# Patient Record
Sex: Female | Born: 1985 | Race: White | Hispanic: No | Marital: Married | Smoking: Never smoker
Health system: Southern US, Community
[De-identification: ages and names within clinical notes are randomized; demographics above are authoritative.]

## PROBLEM LIST (undated history)

## (undated) ENCOUNTER — Inpatient Hospital Stay (HOSPITAL_COMMUNITY): Payer: Self-pay

## (undated) DIAGNOSIS — R519 Headache, unspecified: Secondary | ICD-10-CM

## (undated) HISTORY — PX: TONSILLECTOMY: SUR1361

## (undated) HISTORY — PX: OTHER SURGICAL HISTORY: SHX169

## (undated) HISTORY — PX: WISDOM TOOTH EXTRACTION: SHX21

---
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
## 2017-04-28 ENCOUNTER — Inpatient Hospital Stay (HOSPITAL_COMMUNITY)
Admission: AD | Admit: 2017-04-28 | Discharge: 2017-04-28 | Disposition: A | Discharge status: Home / Self Care | Payer: 59 | Source: Ambulatory Visit | Attending: Obstetrics and Gynecology | Admitting: Obstetrics and Gynecology

## 2017-04-28 DIAGNOSIS — O00109 Unspecified tubal pregnancy without intrauterine pregnancy: Secondary | ICD-10-CM | POA: Insufficient documentation

## 2017-04-28 LAB — CBC
HCT: 36.4 % (ref 36.0–46.0)
HEMOGLOBIN: 13.1 g/dL (ref 12.0–15.0)
MCH: 31.7 pg (ref 26.0–34.0)
MCHC: 36 g/dL (ref 30.0–36.0)
MCV: 88.1 fL (ref 78.0–100.0)
Platelets: 244 10*3/uL (ref 150–400)
RBC: 4.13 MIL/uL (ref 3.87–5.11)
RDW: 12 % (ref 11.5–15.5)
WBC: 7.5 10*3/uL (ref 4.0–10.5)

## 2017-04-28 LAB — ABO/RH: ABO/RH(D): A POS

## 2017-04-28 LAB — COMPREHENSIVE METABOLIC PANEL
ALBUMIN: 4.7 g/dL (ref 3.5–5.0)
ALK PHOS: 53 U/L (ref 38–126)
ALT: 20 U/L (ref 14–54)
AST: 18 U/L (ref 15–41)
Anion gap: 8 (ref 5–15)
BUN: 14 mg/dL (ref 6–20)
CALCIUM: 9.1 mg/dL (ref 8.9–10.3)
CHLORIDE: 104 mmol/L (ref 101–111)
CO2: 27 mmol/L (ref 22–32)
CREATININE: 0.81 mg/dL (ref 0.44–1.00)
GFR calc Af Amer: >60 mL/min (ref >60)
GFR calc non Af Amer: >60 mL/min (ref >60)
GLUCOSE: 106 mg/dL — AB (ref 65–99)
Potassium: 3.4 mmol/L — ABNORMAL LOW (ref 3.5–5.1)
SODIUM: 139 mmol/L (ref 135–145)
Total Bilirubin: 0.8 mg/dL (ref 0.3–1.2)
Total Protein: 7.7 g/dL (ref 6.5–8.1)

## 2017-04-28 LAB — TYPE AND SCREEN
ABO/RH(D): A POS
ANTIBODY SCREEN: NEGATIVE

## 2017-04-28 LAB — HCG, QUANTITATIVE, PREGNANCY: hCG, Beta Chain, Quant, S: 788 m[IU]/mL — ABNORMAL HIGH (ref <5)

## 2017-04-28 MED ORDER — METHOTREXATE INJECTION FOR WOMEN'S HOSPITAL
50.0000 mg/m2 | Freq: Once | INTRAMUSCULAR | Status: AC
  Administered 2017-04-28: 80 mg via INTRAMUSCULAR
  Filled 2017-04-28: qty 1.6

## 2017-04-28 NOTE — MAU Note (Signed)
Chief Complaint: methotrexate injection   None    SUBJECTIVE HPI: Michaela Miller is a 31 y.o. No obstetric history on file. at Unknown who presents to Maternity Admissions reporting mod bleeding but no pain.  Presented for methotrexate injection after [place] in office revealed no IUP with hypoechoic structure on rt ovary poss enlarge fallopian tube.  Ectopic could not be ruled out.  PT QBHC were 04/22/2017 1628, 04/24/2017 1260.  Location: vaginal bleeding Quality: mod Duration: a week No past medical history on file. OB History  No data available   No past surgical history on file. Social History   Social History  . Marital status: Single    Spouse name: N/A  . Number of children: N/A  . Years of education: N/A   Occupational History  . Not on file.   Social History Main Topics  . Smoking status: Not on file  . Smokeless tobacco: Not on file  . Alcohol use Not on file  . Drug use: Unknown  . Sexual activity: Not on file   Other Topics Concern  . Not on file   Social History Narrative  . No narrative on file   No family history on file. No current facility-administered medications on file prior to encounter.    No current outpatient prescriptions on file prior to encounter.   Allergies  Allergen Reactions  . Codeine Shortness Of Breath  . Amoxicillin Rash  . Ceclor [Cefaclor] Rash  . Penicillins Rash  . Sulfa Antibiotics Rash    I have reviewed patient's Past Medical Hx, Surgical Hx, Family Hx, Social Hx, medications and allergies.   Review of Systems  Constitutional: Negative.   HENT: Negative.   Respiratory: Negative.   Cardiovascular: Negative.   Gastrointestinal: Negative.   Endocrine: Negative.   Genitourinary: Positive for vaginal bleeding.  Musculoskeletal: Negative.   Skin: Negative.   Allergic/Immunologic: Negative.   Neurological: Negative.   Hematological: Negative.   Psychiatric/Behavioral: Negative.     OBJECTIVE Patient Vitals for the past  24 hrs:  BP Temp Pulse Resp Height Weight  04/28/17 1849 131/75 98.5 F (36.9 C) 79 18  (1.676 m) 52.6 kg (116 lb)   Constitutional: Well-developed, well-nourished female in no acute distress.  Cardiovascular: normal rate Respiratory: normal rate and effort.   LAB RESULTS Results for orders placed or performed during the hospital encounter of 04/28/17 (from the past 24 hour(s))  Type and screen [facility] [place]     Status: None   Collection Time: 04/28/17  7:20 PM  Result Value Ref Range   ABO/RH(D) A POS    Antibody Screen NEG    Sample Expiration 05/01/2017   CBC     Status: None   Collection Time: 04/28/17  7:20 PM  Result Value Ref Range   WBC 7.5 4.0 - 10.5 K/uL   RBC 4.13 3.87 - 5.11 MIL/uL   Hemoglobin 13.1 12.0 - 15.0 g/dL   HCT 32.2 02.5 - 42.7 %   MCV 88.1 78.0 - 100.0 fL   MCH 31.7 26.0 - 34.0 pg   MCHC 36.0 30.0 - 36.0 g/dL   RDW 06.2 37.6 - 28.3 %   Platelets 244 150 - 400 K/uL  Comprehensive metabolic panel     Status: Abnormal   Collection Time: 04/28/17  7:20 PM  Result Value Ref Range   Sodium 139 135 - 145 mmol/L   Potassium 3.4 (L) 3.5 - 5.1 mmol/L   Chloride 104 101 - 111 mmol/L  CO2 27 22 - 32 mmol/L   Glucose, Bld 106 (H) 65 - 99 mg/dL   BUN 14 6 - 20 mg/dL   Creatinine, Ser 1.610.81 0.44 - 1.00 mg/dL   Calcium 9.1 8.9 - 09.610.3 mg/dL   Total Protein 7.7 6.5 - 8.1 g/dL   Albumin 4.7 3.5 - 5.0 g/dL   AST 18 15 - 41 U/L   ALT 20 14 - 54 U/L   Alkaline Phosphatase 53 38 - 126 U/L   Total Bilirubin 0.8 0.3 - 1.2 mg/dL   GFR calc non Af Amer >60 >60 mL/min   GFR calc Af Amer >60 >60 mL/min   Anion gap 8 5 - 15  hCG, quantitative, pregnancy     Status: Abnormal   Collection Time: 04/28/17  7:20 PM  Result Value Ref Range   hCG, Beta Chain, Quant, S 788 (H) <5 mIU/mL    IMAGING No results found.  MAU COURSE Orders Placed This Encounter  Procedures  . CBC  . Comprehensive metabolic panel  . hCG, quantitative, pregnancy  . Type  [facility] screen [facility] [place]  . ABO/Rh   Meds ordered this encounter  Medications  . methotrexate (50 mg/ml) chemo injection 80 mg    MDM Labs, injection ASSESSMENT Ectopic Pregnancy  PLAN POC reviewed with Dr. Richardson Doppole. Discussed results with pt.  Pt given Methotrexate and will follow up in office on day 4 and day 7.   Discharge home in stable condition. Ectopic precautions reviewed.     Kenney Housemanrothero, Roselyne Stalnaker Jean, CNM 04/28/2017  9:23 PM

## 2017-04-28 NOTE — MAU Note (Signed)
Pt presents for methotrexate injection. Reports pain in her right lower abdomen. Moderate amount of vaginal bleeding

## 2017-09-17 ENCOUNTER — Ambulatory Visit
Admission: EM | Admit: 2017-09-17 | Discharge: 2017-09-17 | Disposition: A | Discharge status: Home / Self Care | Payer: 59 | Attending: Family Medicine | Admitting: Family Medicine

## 2017-09-17 ENCOUNTER — Encounter: Payer: Self-pay | Admitting: Emergency Medicine

## 2017-09-17 ENCOUNTER — Other Ambulatory Visit: Payer: Self-pay

## 2017-09-17 DIAGNOSIS — S39012A Strain of muscle, fascia and tendon of lower back, initial encounter: Secondary | ICD-10-CM | POA: Diagnosis not present

## 2017-09-17 DIAGNOSIS — M542 Cervicalgia: Secondary | ICD-10-CM | POA: Diagnosis not present

## 2017-09-17 DIAGNOSIS — S29012A Strain of muscle and tendon of back wall of thorax, initial encounter: Secondary | ICD-10-CM

## 2017-09-17 DIAGNOSIS — R51 Headache: Secondary | ICD-10-CM | POA: Diagnosis not present

## 2017-09-17 MED ORDER — CYCLOBENZAPRINE HCL 10 MG PO TABS: 10.0000 mg | ORAL_TABLET | Freq: Every day | ORAL | 0 refills | Status: DC
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
## 2017-09-20 ENCOUNTER — Telehealth: Payer: Self-pay

## 2017-09-20 NOTE — Telephone Encounter (Signed)
Called to follow up with patient since visit here at [facility]. Patient instructed to call back with any questions or concerns. [facility]  

## 2017-09-22 ENCOUNTER — Other Ambulatory Visit: Payer: Self-pay

## 2017-09-22 ENCOUNTER — Encounter: Payer: Self-pay | Admitting: Emergency Medicine

## 2017-09-22 ENCOUNTER — Ambulatory Visit
Admission: EM | Admit: 2017-09-22 | Discharge: 2017-09-22 | Disposition: A | Discharge status: Home / Self Care | Payer: 59 | Attending: Family Medicine | Admitting: Family Medicine

## 2017-09-22 DIAGNOSIS — S39012D Strain of muscle, fascia and tendon of lower back, subsequent encounter: Secondary | ICD-10-CM | POA: Diagnosis not present

## 2017-09-22 DIAGNOSIS — S060X0A Concussion without loss of consciousness, initial encounter: Secondary | ICD-10-CM

## 2017-09-22 DIAGNOSIS — S060X0D Concussion without loss of consciousness, subsequent encounter: Secondary | ICD-10-CM | POA: Diagnosis not present

## 2017-09-22 DIAGNOSIS — F0781 Postconcussional syndrome: Secondary | ICD-10-CM
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
## 2017-09-25 ENCOUNTER — Telehealth: Payer: Self-pay

## 2017-09-25 NOTE — Telephone Encounter (Signed)
[facility] Spoke with pt, reports some improvement. Encouraged follow up as needed. Patient instructed to call back with any questions or concerns. [facility]

## 2017-10-02 DIAGNOSIS — M546 Pain in thoracic spine: Secondary | ICD-10-CM | POA: Diagnosis not present

## 2017-10-02 DIAGNOSIS — M419 Scoliosis, unspecified: Secondary | ICD-10-CM | POA: Diagnosis not present

## 2017-10-02 DIAGNOSIS — M549 Dorsalgia, unspecified: Secondary | ICD-10-CM | POA: Diagnosis not present

## 2017-10-02 DIAGNOSIS — M542 Cervicalgia: Secondary | ICD-10-CM | POA: Diagnosis not present

## 2017-10-20 DIAGNOSIS — M542 Cervicalgia: Secondary | ICD-10-CM | POA: Diagnosis not present

## 2017-10-20 DIAGNOSIS — M545 Low back pain: Secondary | ICD-10-CM | POA: Diagnosis not present

## 2017-10-20 DIAGNOSIS — M419 Scoliosis, unspecified: Secondary | ICD-10-CM | POA: Diagnosis not present

## 2017-12-19 DIAGNOSIS — M542 Cervicalgia: Secondary | ICD-10-CM | POA: Diagnosis not present

## 2017-12-19 DIAGNOSIS — R51 Headache: Secondary | ICD-10-CM | POA: Diagnosis not present

## 2017-12-19 DIAGNOSIS — Z681 Body mass index (BMI) 19 or less, adult: Secondary | ICD-10-CM | POA: Diagnosis not present

## 2018-02-17 DIAGNOSIS — R51 Headache: Secondary | ICD-10-CM | POA: Diagnosis not present

## 2018-02-17 DIAGNOSIS — R29818 Other symptoms and signs involving the nervous system: Secondary | ICD-10-CM | POA: Diagnosis not present

## 2018-02-17 DIAGNOSIS — M542 Cervicalgia: Secondary | ICD-10-CM | POA: Diagnosis not present

## 2018-02-25 DIAGNOSIS — M542 Cervicalgia: Secondary | ICD-10-CM | POA: Diagnosis not present

## 2018-02-25 DIAGNOSIS — R51 Headache: Secondary | ICD-10-CM | POA: Diagnosis not present

## 2018-03-03 ENCOUNTER — Encounter

## 2018-03-03 ENCOUNTER — Ambulatory Visit: Payer: 59 | Admitting: Neurology

## 2018-03-09 DIAGNOSIS — M542 Cervicalgia: Secondary | ICD-10-CM | POA: Diagnosis not present

## 2018-03-09 DIAGNOSIS — R51 Headache: Secondary | ICD-10-CM | POA: Diagnosis not present

## 2018-03-09 DIAGNOSIS — Z3040 Encounter for surveillance of contraceptives, unspecified: Secondary | ICD-10-CM | POA: Diagnosis not present

## 2018-03-09 DIAGNOSIS — Z6820 Body mass index (BMI) 20.0-20.9, adult: Secondary | ICD-10-CM | POA: Diagnosis not present

## 2018-03-09 DIAGNOSIS — Z01419 Encounter for gynecological examination (general) (routine) without abnormal findings: Secondary | ICD-10-CM | POA: Diagnosis not present

## 2018-03-18 DIAGNOSIS — J069 Acute upper respiratory infection, unspecified: Secondary | ICD-10-CM | POA: Diagnosis not present

## 2018-03-18 DIAGNOSIS — J029 Acute pharyngitis, unspecified: Secondary | ICD-10-CM | POA: Diagnosis not present

## 2018-06-12 DIAGNOSIS — G43919 Migraine, unspecified, intractable, without status migrainosus: Secondary | ICD-10-CM | POA: Diagnosis not present

## 2018-06-12 DIAGNOSIS — Z6820 Body mass index (BMI) 20.0-20.9, adult: Secondary | ICD-10-CM | POA: Diagnosis not present

## 2018-07-22 DIAGNOSIS — G43919 Migraine, unspecified, intractable, without status migrainosus: Secondary | ICD-10-CM | POA: Diagnosis not present

## 2018-09-22 DIAGNOSIS — G43919 Migraine, unspecified, intractable, without status migrainosus: Secondary | ICD-10-CM | POA: Diagnosis not present

## 2018-09-22 DIAGNOSIS — Z6820 Body mass index (BMI) 20.0-20.9, adult: Secondary | ICD-10-CM | POA: Diagnosis not present

## 2019-07-27 ENCOUNTER — Encounter (HOSPITAL_COMMUNITY): Payer: Self-pay

## 2019-08-20 NOTE — L&D Delivery Note (Signed)
Delivery Note Labor onset: 02/14/2020  Labor Onset Time: 1530 Complete dilation at 9:46 PM  Onset of pushing at 2148 FHR second stage Cat 1 Analgesia/Anesthesia intrapartum: Epidural  Guided pushing with maternal urge. Delivery of a viable female at 2224. Fetal head delivered in LOA position. Nuchal cord none. Head delivered w/ force of 2 ctx with well-controlled pushing efforts. Shoulders immediately followed and father allowed to assist w/ delivery of torso and placing infant on maternal abd. Lusty cry noted at birth. Infant placed on maternal abd, dried, and tactile stim.  Cord double clamped and cut after 2 min by Father, Chanetta Marshall.  Cord blood sample collected Arterial cord blood sample N/A.  Placenta delivered Tomasa Blase, intact, with 3 VC.  Placenta to L&D. Uterine tone boggy, bleeding brisk. Fundus firm and bleeding decreased after bimanual massage, Pitocin bolus, and Cytotec 800 mcg rectall  Vaginal and right labial laceration identified w/ perineum intact.  Anesthesia: Epidural Repair: 3-0 Vicryl CT, 4-0 Vicryl SH EBL (mL): 400 Complications: none APGAR: APGAR (1 MIN): 8  APGAR (5 MINS):  9 APGAR (10 MINS):   Mom to postpartum.  Baby to Couplet care / Skin to Skin.  Roma Schanz MSN, CNM 02/14/2020, 11:18 PM

## 2019-09-09 ENCOUNTER — Other Ambulatory Visit (HOSPITAL_COMMUNITY): Payer: Self-pay | Admitting: Obstetrics and Gynecology

## 2019-09-09 DIAGNOSIS — Z3A19 19 weeks gestation of pregnancy: Secondary | ICD-10-CM

## 2019-09-09 DIAGNOSIS — Z363 Encounter for antenatal screening for malformations: Secondary | ICD-10-CM

## 2019-09-22 ENCOUNTER — Other Ambulatory Visit: Payer: Self-pay

## 2019-09-22 ENCOUNTER — Ambulatory Visit (HOSPITAL_COMMUNITY)
Admission: RE | Admit: 2019-09-22 | Discharge: 2019-09-22 | Disposition: A | Discharge status: Home / Self Care | Payer: Managed Care, Other (non HMO) | Source: Ambulatory Visit | Attending: Obstetrics and Gynecology | Admitting: Obstetrics and Gynecology

## 2019-09-22 ENCOUNTER — Other Ambulatory Visit (HOSPITAL_COMMUNITY): Payer: Self-pay | Admitting: Family Medicine

## 2019-09-22 DIAGNOSIS — Z3A19 19 weeks gestation of pregnancy: Secondary | ICD-10-CM | POA: Diagnosis not present

## 2019-09-22 DIAGNOSIS — Z363 Encounter for antenatal screening for malformations: Secondary | ICD-10-CM | POA: Insufficient documentation

## 2020-01-21 LAB — OB RESULTS CONSOLE GBS: GBS: NEGATIVE

## 2020-02-14 ENCOUNTER — Inpatient Hospital Stay (HOSPITAL_COMMUNITY): Payer: Managed Care, Other (non HMO) | Admitting: Anesthesiology

## 2020-02-14 ENCOUNTER — Inpatient Hospital Stay (HOSPITAL_COMMUNITY)
Admission: AD | Admit: 2020-02-14 | Discharge: 2020-02-14 | Disposition: A | Discharge status: Home / Self Care | Payer: Managed Care, Other (non HMO) | Source: Home / Self Care | Attending: Obstetrics and Gynecology | Admitting: Obstetrics and Gynecology

## 2020-02-14 ENCOUNTER — Other Ambulatory Visit: Payer: Self-pay

## 2020-02-14 ENCOUNTER — Inpatient Hospital Stay (HOSPITAL_COMMUNITY)
Admission: AD | Admit: 2020-02-14 | Discharge: 2020-02-16 | DRG: 807 | Disposition: A | Discharge status: Home / Self Care | Payer: Managed Care, Other (non HMO) | Attending: Obstetrics and Gynecology | Admitting: Obstetrics and Gynecology

## 2020-02-14 ENCOUNTER — Encounter (HOSPITAL_COMMUNITY): Payer: Self-pay | Admitting: Obstetrics and Gynecology

## 2020-02-14 DIAGNOSIS — O479 False labor, unspecified: Secondary | ICD-10-CM

## 2020-02-14 DIAGNOSIS — Z3A39 39 weeks gestation of pregnancy: Secondary | ICD-10-CM | POA: Diagnosis not present

## 2020-02-14 DIAGNOSIS — O26893 Other specified pregnancy related conditions, third trimester: Secondary | ICD-10-CM | POA: Diagnosis present

## 2020-02-14 DIAGNOSIS — Z20822 Contact with and (suspected) exposure to covid-19: Secondary | ICD-10-CM | POA: Diagnosis present

## 2020-02-14 HISTORY — DX: Headache, unspecified: R51.9

## 2020-02-14 LAB — CBC
HCT: 34.4 % — ABNORMAL LOW (ref 36.0–46.0)
Hemoglobin: 11.8 g/dL — ABNORMAL LOW (ref 12.0–15.0)
MCH: 30.3 pg (ref 26.0–34.0)
MCHC: 34.3 g/dL (ref 30.0–36.0)
MCV: 88.4 fL (ref 80.0–100.0)
Platelets: 237 10*3/uL (ref 150–400)
RBC: 3.89 MIL/uL (ref 3.87–5.11)
RDW: 12.3 % (ref 11.5–15.5)
WBC: 15.8 10*3/uL — ABNORMAL HIGH (ref 4.0–10.5)
nRBC: 0 % (ref 0.0–0.2)

## 2020-02-14 LAB — SARS CORONAVIRUS 2 BY RT PCR (HOSPITAL ORDER, PERFORMED IN ~~LOC~~ HOSPITAL LAB): SARS Coronavirus 2: NEGATIVE

## 2020-02-14 LAB — TYPE AND SCREEN
ABO/RH(D): A POS
Antibody Screen: NEGATIVE

## 2020-02-14 LAB — ABO/RH: ABO/RH(D): A POS

## 2020-02-14 MED ORDER — MISOPROSTOL 200 MCG PO TABS: 800.0000 ug | ORAL_TABLET | Freq: Once | ORAL | Status: AC

## 2020-02-14 MED ORDER — SOD CITRATE-CITRIC ACID 500-334 MG/5ML PO SOLN: 30.0000 mL | ORAL | Status: DC | PRN

## 2020-02-14 MED ORDER — LIDOCAINE HCL (PF) 1 % IJ SOLN
INTRAMUSCULAR | Status: DC | PRN
  Administered 2020-02-14: 4 mL via EPIDURAL
  Administered 2020-02-14: 6 mL via EPIDURAL

## 2020-02-14 MED ORDER — SODIUM CHLORIDE (PF) 0.9 % IJ SOLN
INTRAMUSCULAR | Status: DC | PRN
  Administered 2020-02-14: 12 mL/h via EPIDURAL

## 2020-02-14 MED ORDER — OXYTOCIN BOLUS FROM INFUSION
333.0000 mL | Freq: Once | INTRAVENOUS | Status: AC
  Administered 2020-02-14: 333 mL via INTRAVENOUS

## 2020-02-14 MED ORDER — PHENYLEPHRINE 40 MCG/ML (10ML) SYRINGE FOR IV PUSH (FOR BLOOD PRESSURE SUPPORT): 80.0000 ug | PREFILLED_SYRINGE | INTRAVENOUS | Status: DC | PRN

## 2020-02-14 MED ORDER — FENTANYL CITRATE (PF) 100 MCG/2ML IJ SOLN: 50.0000 ug | INTRAMUSCULAR | Status: DC | PRN

## 2020-02-14 MED ORDER — LACTATED RINGERS IV SOLN: 500.0000 mL | Freq: Once | INTRAVENOUS | Status: DC

## 2020-02-14 MED ORDER — ACETAMINOPHEN 325 MG PO TABS: 650.0000 mg | ORAL_TABLET | ORAL | Status: DC | PRN

## 2020-02-14 MED ORDER — ONDANSETRON HCL 4 MG/2ML IJ SOLN: 4.0000 mg | Freq: Four times a day (QID) | INTRAMUSCULAR | Status: DC | PRN

## 2020-02-14 MED ORDER — LACTATED RINGERS IV SOLN: INTRAVENOUS | Status: DC

## 2020-02-14 MED ORDER — OXYTOCIN-SODIUM CHLORIDE 30-0.9 UT/500ML-% IV SOLN
2.5000 [IU]/h | INTRAVENOUS | Status: DC
  Filled 2020-02-14: qty 500

## 2020-02-14 MED ORDER — LIDOCAINE HCL (PF) 1 % IJ SOLN: 30.0000 mL | INTRAMUSCULAR | Status: DC | PRN

## 2020-02-14 MED ORDER — FENTANYL-BUPIVACAINE-NACL 0.5-0.125-0.9 MG/250ML-% EP SOLN
12.0000 mL/h | EPIDURAL | Status: DC | PRN
  Filled 2020-02-14: qty 250

## 2020-02-14 MED ORDER — MISOPROSTOL 200 MCG PO TABS
ORAL_TABLET | ORAL | Status: AC
  Administered 2020-02-14: 800 ug
  Filled 2020-02-14: qty 4

## 2020-02-14 MED ORDER — EPHEDRINE 5 MG/ML INJ: 10.0000 mg | INTRAVENOUS | Status: DC | PRN

## 2020-02-14 MED ORDER — DIPHENHYDRAMINE HCL 50 MG/ML IJ SOLN: 12.5000 mg | INTRAMUSCULAR | Status: DC | PRN

## 2020-02-14 MED ORDER — LACTATED RINGERS IV SOLN: 500.0000 mL | INTRAVENOUS | Status: DC | PRN

## 2020-02-14 NOTE — MAU Note (Signed)
First baby.  1/90/-1 at [facility] this morning.  Started contracting last night, became more intense around 0400, now ~Q6-7.

## 2020-02-14 NOTE — MAU Note (Signed)
Contractions have gotten stronger and closer.  Denies bleeding or LOF.

## 2020-02-14 NOTE — Anesthesia Procedure Notes (Signed)
Epidural Patient location during procedure: OB Start time: 02/14/2020 6:40 PM End time: 02/14/2020 6:51 PM  Staffing Anesthesiologist: Lucretia Kern, MD Performed: anesthesiologist   Preanesthetic Checklist Completed: patient identified, IV checked, risks and benefits discussed, monitors and equipment checked, pre-op evaluation and timeout performed  Epidural Patient position: sitting Prep: DuraPrep Patient monitoring: heart rate, continuous pulse ox and blood pressure Approach: midline Location: L3-L4 Injection technique: LOR air  Needle:  Needle type: Tuohy  Needle gauge: 17 G Needle length: 9 cm Needle insertion depth: 4 cm Catheter type: closed end flexible Catheter size: 19 Gauge Catheter at skin depth: 9 cm Test dose: negative  Assessment Events: blood not aspirated, injection not painful, no injection resistance, no paresthesia and negative IV test  Additional Notes Reason for block:procedure for pain

## 2020-02-14 NOTE — H&P (Addendum)
OB ADMISSION/ HISTORY & PHYSICAL:  Admission Date: 02/14/2020  4:05 PM  Admit Diagnosis: Prodromal latent labor  Michaela Miller is a 34 y.o. female G2P0010 [redacted]w[redacted]d presenting for increase in strength and frequency of contractions. Endorses active FM, denies LOF and vaginal bleeding. Ctx began @ 0400 about 5-7 min. Pt was seen in the office and found to be 1cm/90% and sent to MAU for NST and labor check. No cervical change noted after 2 hours, pt discharged w/ labor precautions. Pt has returned w/ c/o increase in frequency and pain of ctx and noted cervical change from 1.5 cm to 3 cm.   History of current pregnancy: G2P0010   Patient entered care with [facility] at 13+2 wks.   EDC of 02/15/20 was established by [place] at 11+0 wks not congruent w/LMP due to hx of short cycles   Anatomy scan:  19+1 wks, with normal findings, breech position, and posterior placenta.   Antenatal testing: N/A Last evaluation: 33 +6 wks vertex per bedside US  Significant prenatal events: none There are no problems to display for this patient.   Prenatal Labs: ABO, Rh: --/--/A POS, A POS Performed at [facility], [place] [address], [place], [place] [address]  [phone]) Antibody: NEG (06/28 1654) Rubella:   immune RPR:   NR HBsAg:   NR HIV:   NR GTT: passed 1 hour GBS:   negative GC/CHL: neg/neg Genetics: low-risk Vaccines: declined influenza, tdap given this preg    OB History  Gravida Para Term Preterm AB Living  2 0 0 0 1 0  SAB TAB Ectopic Multiple Live Births  1 0 0 0 0    # Outcome Date GA Lbr Len/2nd Weight Sex Delivery Anes PTL Lv  2 Current           1 SAB             Medical / Surgical History: Past medical history: No past medical history on file.  Past surgical history:  Past Surgical History:  Procedure Laterality Date  . dental implants    . TONSILLECTOMY    . WISDOM TOOTH EXTRACTION     Family History:  Family History  Problem Relation Age of Onset  . Arrhythmia Mother   .  Hypertension Father     Social History:  reports that she has never smoked. She has never used smokeless tobacco. She reports previous alcohol use. She reports that she does not use drugs.  Allergies: Codeine, Amoxicillin, Ceclor [cefaclor], Penicillins, and Sulfa antibiotics   Current Medications at time of admission:  Prior to Admission medications   Medication Sig Start Date End Date Taking? Authorizing Provider  acetaminophen (TYLENOL) 500 MG tablet Take 1,000 mg by mouth every 6 (six) hours as needed.    [provider]  cyclobenzaprine (FLEXERIL) 10 MG tablet Take 1 tablet (10 mg total) by mouth at bedtime. 09/17/17   Norval Gable, MD  ELDERBERRY PO Take by mouth.    [provider]  Multiple Vitamin (MULTIVITAMIN) tablet Take 1 tablet by mouth daily.    [provider]    Review of Systems: Constitutional: Negative   HENT: Negative   Eyes: Negative   Respiratory: Negative   Cardiovascular: Negative   Gastrointestinal: Negative  Genitourinary: positive for bloody show, negative for LOF   Musculoskeletal: Negative   Skin: Negative   Neurological: Negative   Endo/Heme/Allergies: Negative   Psychiatric/Behavioral: Negative    Physical Exam: VS: Blood pressure 130/74, pulse  66, temperature 97.8 F (36.6 C), temperature source Oral, resp. rate 18. AAO x3, no signs of distress Cardiovascular: RRR Respiratory: Lung fields clear to ausculation GU/GI: Abdomen gravid, non-tender, non-distended, active FM, vertex Extremities: 1+non-pitting edema BLE, negative for pain, tenderness, and cords  Cervical exam:Dilation: 3 Effacement (%): 90 Station: 0 Exam by:: Leafy Ro, RN FHR: baseline rate 145 / variability moderate / accelerations present / absent decelerations TOCO: 3-4 min   Prenatal Transfer Tool  Maternal Diabetes: No Genetic Screening: Normal Maternal Ultrasounds/Referrals: Normal Fetal Ultrasounds or other Referrals:  None Maternal  Substance Abuse:  No Significant Maternal Medications:  None Significant Maternal Lab Results: Group B Strep negative    Assessment: 34 y.o. G2P0010 [redacted]w[redacted]d  Latent stage of labor FHR category 1 GBS negative Pain management plan: epidural   Plan:  Admit to L&D Routine admission orders Epidural PRN  Dr Su Hilt notified of admission and plan of care  Roma Schanz MSN, CNM 02/14/2020 5:10 PM

## 2020-02-14 NOTE — Progress Notes (Signed)
Subjective:    Comfortable w/ epidural.   Objective:    VS: BP 110/64   Pulse (!) 59   Temp 98 F (36.7 C) (Oral)   Resp 20   SpO2 100%  FHR : baseline 135 / variability moderate / accelerations present / absent decelerations Toco: contractions every 2-4 minutes Membranes: AROM, scant, bloody Dilation: Lip/rim Effacement (%): 100 Cervical Position: Anterior Station: Plus 1 Presentation: Vertex Exam by:: Rhea Pink, CNM  Assessment/Plan:   34 y.o. G2P0010 [redacted]w[redacted]d  Labor: Progressing normally Preeclampsia:  no signs or symptoms of toxicity Fetal Wellbeing:  Category I Pain Control:  Epidural I/D:  GBS negative Anticipated MOD:  NSVD  Roma Schanz MSN, CNM 02/14/2020 9:31 PM

## 2020-02-14 NOTE — Discharge Instructions (Signed)
Braxton Hicks Contractions °Contractions of the uterus can occur throughout pregnancy, but they are not always a sign that you are in labor. You may have practice contractions called Braxton Hicks contractions. These false labor contractions are sometimes confused with true labor. °What are Braxton Hicks contractions? °Braxton Hicks contractions are tightening movements that occur in the muscles of the uterus before labor. Unlike true labor contractions, these contractions do not result in opening (dilation) and thinning of the cervix. Toward the end of pregnancy (32-34 weeks), Braxton Hicks contractions can happen more often and may become stronger. These contractions are sometimes difficult to tell apart from true labor because they can be very uncomfortable. You should not feel embarrassed if you go to the hospital with false labor. °Sometimes, the only way to tell if you are in true labor is for your health care provider to look for changes in the cervix. The health care provider will do a physical exam and may monitor your contractions. If you are not in true labor, the exam should show that your cervix is not dilating and your water has not broken. °If there are no other health problems associated with your pregnancy, it is completely safe for you to be sent home with false labor. You may continue to have Braxton Hicks contractions until you go into true labor. °How to tell the difference between true labor and false labor °True labor °· Contractions last 30-70 seconds. °· Contractions become very regular. °· Discomfort is usually felt in the top of the uterus, and it spreads to the lower abdomen and low back. °· Contractions do not go away with walking. °· Contractions usually become more intense and increase in frequency. °· The cervix dilates and gets thinner. °False labor °· Contractions are usually shorter and not as strong as true labor contractions. °· Contractions are usually irregular. °· Contractions  are often felt in the front of the lower abdomen and in the groin. °· Contractions may go away when you walk around or change positions while lying down. °· Contractions get weaker and are shorter-lasting as time goes on. °· The cervix usually does not dilate or become thin. °Follow these instructions at home: ° °· Take over-the-counter and prescription medicines only as told by your health care provider. °· Keep up with your usual exercises and follow other instructions from your health care provider. °· Eat and drink lightly if you think you are going into labor. °· If Braxton Hicks contractions are making you uncomfortable: °? Change your position from lying down or resting to walking, or change from walking to resting. °? Sit and rest in a tub of warm water. °? Drink enough fluid to keep your urine pale yellow. Dehydration may cause these contractions. °? Do slow and deep breathing several times an hour. °· Keep all follow-up prenatal visits as told by your health care provider. This is important. °Contact a health care provider if: °· You have a fever. °· You have continuous pain in your abdomen. °Get help right away if: °· Your contractions become stronger, more regular, and closer together. °· You have fluid leaking or gushing from your vagina. °· You pass blood-tinged mucus (bloody show). °· You have bleeding from your vagina. °· You have low back pain that you never had before. °· You feel your baby’s head pushing down and causing pelvic pressure. °· Your baby is not moving inside you as much as it used to. °Summary °· Contractions that occur before labor are   called Braxton Hicks contractions, false labor, or practice contractions. °· Braxton Hicks contractions are usually shorter, weaker, farther apart, and less regular than true labor contractions. True labor contractions usually become progressively stronger and regular, and they become more frequent. °· Manage discomfort from Braxton Hicks contractions  by changing position, resting in a warm bath, drinking plenty of water, or practicing deep breathing. °This information is not intended to replace advice given to you by your health care provider. Make sure you discuss any questions you have with your health care provider. °Document Revised: 07/18/2017 Document Reviewed: 12/19/2016 °Elsevier Patient Education © 2020 [employer] ° °

## 2020-02-14 NOTE — Anesthesia Preprocedure Evaluation (Signed)

## 2020-02-15 ENCOUNTER — Encounter (HOSPITAL_COMMUNITY): Payer: Self-pay | Admitting: Obstetrics and Gynecology

## 2020-02-15 LAB — CBC
HCT: 31.9 % — ABNORMAL LOW (ref 36.0–46.0)
Hemoglobin: 10.6 g/dL — ABNORMAL LOW (ref 12.0–15.0)
MCH: 29.9 pg (ref 26.0–34.0)
MCHC: 33.2 g/dL (ref 30.0–36.0)
MCV: 90.1 fL (ref 80.0–100.0)
Platelets: 195 10*3/uL (ref 150–400)
RBC: 3.54 MIL/uL — ABNORMAL LOW (ref 3.87–5.11)
RDW: 12.3 % (ref 11.5–15.5)
WBC: 15.4 10*3/uL — ABNORMAL HIGH (ref 4.0–10.5)
nRBC: 0 % (ref 0.0–0.2)

## 2020-02-15 LAB — RPR: RPR Ser Ql: NONREACTIVE

## 2020-02-15 MED ORDER — DIBUCAINE (PERIANAL) 1 % EX OINT: 1.0000 "application " | TOPICAL_OINTMENT | CUTANEOUS | Status: DC | PRN

## 2020-02-15 MED ORDER — ONDANSETRON HCL 4 MG/2ML IJ SOLN: 4.0000 mg | INTRAMUSCULAR | Status: DC | PRN

## 2020-02-15 MED ORDER — IBUPROFEN 600 MG PO TABS
600.0000 mg | ORAL_TABLET | Freq: Four times a day (QID) | ORAL | Status: DC
  Administered 2020-02-15 – 2020-02-16 (×6): 600 mg via ORAL
  Filled 2020-02-15 (×6): qty 1

## 2020-02-15 MED ORDER — BENZOCAINE-MENTHOL 20-0.5 % EX AERO
1.0000 "application " | INHALATION_SPRAY | CUTANEOUS | Status: DC | PRN
  Administered 2020-02-15: 1 via TOPICAL
  Filled 2020-02-15: qty 56

## 2020-02-15 MED ORDER — TETANUS-DIPHTH-ACELL PERTUSSIS 5-2.5-18.5 LF-MCG/0.5 IM SUSP: 0.5000 mL | Freq: Once | INTRAMUSCULAR | Status: DC

## 2020-02-15 MED ORDER — WITCH HAZEL-GLYCERIN EX PADS: 1.0000 "application " | MEDICATED_PAD | CUTANEOUS | Status: DC | PRN

## 2020-02-15 MED ORDER — PRENATAL MULTIVITAMIN CH
1.0000 | ORAL_TABLET | Freq: Every day | ORAL | Status: DC
  Administered 2020-02-15 – 2020-02-16 (×2): 1 via ORAL
  Filled 2020-02-15 (×2): qty 1

## 2020-02-15 MED ORDER — ONDANSETRON HCL 4 MG PO TABS: 4.0000 mg | ORAL_TABLET | ORAL | Status: DC | PRN

## 2020-02-15 MED ORDER — DIPHENHYDRAMINE HCL 25 MG PO CAPS: 25.0000 mg | ORAL_CAPSULE | Freq: Four times a day (QID) | ORAL | Status: DC | PRN

## 2020-02-15 MED ORDER — SIMETHICONE 80 MG PO CHEW: 80.0000 mg | CHEWABLE_TABLET | ORAL | Status: DC | PRN

## 2020-02-15 MED ORDER — SENNOSIDES-DOCUSATE SODIUM 8.6-50 MG PO TABS
2.0000 | ORAL_TABLET | ORAL | Status: DC
  Administered 2020-02-15 – 2020-02-16 (×2): 2 via ORAL
  Filled 2020-02-15: qty 2

## 2020-02-15 MED ORDER — FERROUS SULFATE 325 (65 FE) MG PO TABS
325.0000 mg | ORAL_TABLET | Freq: Two times a day (BID) | ORAL | Status: DC
  Administered 2020-02-15 – 2020-02-16 (×3): 325 mg via ORAL
  Filled 2020-02-15 (×2): qty 1

## 2020-02-15 MED ORDER — COCONUT OIL OIL
1.0000 "application " | TOPICAL_OIL | Status: DC | PRN
  Administered 2020-02-15: 1 via TOPICAL

## 2020-02-15 MED ORDER — ACETAMINOPHEN 325 MG PO TABS
650.0000 mg | ORAL_TABLET | ORAL | Status: DC | PRN
  Administered 2020-02-15: 650 mg via ORAL
  Filled 2020-02-15: qty 2

## 2020-02-15 NOTE — Lactation Note (Deleted)
This note was copied from a baby's chart. Lactation Consultation Note  Patient Name: Michaela Miller [id number] Date: 02/15/2020 Reason for consult: Follow-up assessment;1st time breastfeeding;Primapara;Term;Infant weight loss;Other (Comment) (6 % weight loss/ using a NS to latch /)  Baby is 15 hours old  2nd LC visit to reassess latching and check to see if mom and dad can do the [facility] plan well for D/C .  LC had dad warm up the EBM , and mom was able to apply the #20 NS and instill the EBM  In the top.  LC added the supports and latched the baby easily in the football with depth.  Baby opening wider than this morning and more swallows. Baby fed well and after feeding  The baby's aunt pace fed the baby the rest of the donor milk from a bottle.  LC wrote out a written LC plan on the I/O sheet in steps .  And sore nipple and engorgement prevention and tx.  Mom has been wearing shells and LC recommended continuing until the areola is consistent ' Compressible for a deeper latch.  LC stressed the importance of post pumping / saving milk and feeding back to the baby.  Storage of breast milk reviewed.  LC offered mom request and LC O/P appt and with in 5-7 days and parents mentioned the midwife whom saw them this am gave them a private LC [facility] for hoem visit. Dad mentioned he was going to call her and also to put in the request so they would be covered well.  LC placed a request for LC as requested by mom and dad .     Maternal Data Has patient been taught Hand Expression?: Yes Does the patient have breastfeeding experience prior to this delivery?: No  Feeding Feeding Type: Donor Breast Milk  LATCH Score Latch: Grasps breast easily, tongue down, lips flanged, rhythmical sucking.  Audible Swallowing: Spontaneous and intermittent  Type of Nipple: Everted at rest and after stimulation  Comfort (Breast/Nipple): Soft / non-tender  Hold (Positioning): Assistance needed to  correctly position infant at breast and maintain latch.  LATCH Score: 9  Interventions Interventions: Breast feeding basics reviewed;Assisted with latch;Skin to skin;Breast massage;Hand express;Breast compression;Adjust position;Support pillows;Position options;Expressed milk  Lactation Tools Discussed/Used Tools: Shells;Pump;Flanges;Nipple Shields Nipple shield size: 20 Flange Size: 27 Shell Type: Inverted Breast pump type: Double-Electric Breast Pump;Manual Pump Review: Milk Storage Initiated by:: LC reviewed/ MAI Date initiated:: 02/15/20   Consult Status Consult Status: Follow-up (see LC note) Date: 02/15/20 Follow-up type: Out-patient    Michaela Miller 02/15/2020, 1:39 PM

## 2020-02-15 NOTE — Lactation Note (Signed)
This note was copied from a baby's chart. Lactation Consultation Note  Patient Name: Girl Latoi Giraldo [id number] Date: 02/15/2020 Reason for consult: Follow-up assessment;Primapara;Term Mom called out for latch assessment.  Mom has baby in cross cradle hold.  Latch is deep and lips flanged well.  Mom states initial latch is uncomfortable for a minute but it improves.  Instructed to massage breast during feeding.  Recommended to offer both breasts at a feeding.  Questions answered.  Discussed cluster feeding on days 2-3.  Encouraged to call for assist prn.  Maternal Data Has patient been taught Hand Expression?: Yes (mom said yes and will need a review) Does the patient have breastfeeding experience prior to this delivery?: No  Feeding Feeding Type: Breast Fed  LATCH Score Latch: Grasps breast easily, tongue down, lips flanged, rhythmical sucking.  Audible Swallowing: A few with stimulation  Type of Nipple: Everted at rest and after stimulation  Comfort (Breast/Nipple): Soft / non-tender  Hold (Positioning): No assistance needed to correctly position infant at breast.  LATCH Score: 9  Interventions Interventions: Breast feeding basics reviewed  Lactation Tools Discussed/Used     Consult Status Consult Status: Follow-up Date: 02/16/20 Follow-up type: In-patient    Huston Foley 02/15/2020, 2:46 PM

## 2020-02-15 NOTE — Progress Notes (Addendum)
PPD # 1 S/P NSVD  Live born female  Birth Weight: 6 lb 14.4 oz (3130 g) APGAR: 8, 9  Newborn Delivery   Birth date/time: 02/14/2018 22:24:00 Delivery type: Vaginal, Spontaneous     Baby name: Michaela Miller Delivering provider: Rhea Pink B  Episiotomy:None   Lacerations:1st degree   Feeding: breast  Pain control at delivery: Epidural   S:  Reports feeling well, states she has some visual changes in her right eye right after getting up to use the                  restroom this AM             Tolerating po/ No nausea or vomiting             Bleeding is moderate             Pain controlled with ibuprofen (OTC)             Up ad lib / ambulatory / voiding without difficulties   O:  A & O x 3, in no apparent distress              VS:  Vitals:   02/15/20 0203 02/15/20 0602 02/15/20 0844 02/15/20 1000  BP: 118/79 126/75  121/76  Pulse: 77 64  70  Resp: 18 18  18   Temp: 98.6 F (37 C) 98.3 F (36.8 C)  98 F (36.7 C)  TempSrc: Oral Oral  Oral  SpO2: 100% 100%  99%  Weight:   65.5 kg   Height:   5\' 5"  (1.651 m)     LABS:  Recent Labs    02/14/20 1706 02/15/20 0530  WBC 15.8* 15.4*  HGB 11.8* 10.6*  HCT 34.4* 31.9*  PLT 237 195    Blood type: --/--/A POS, A POS Performed at [facility], [address], [place], [address] [place]  (06/28 1654)  Rubella:     I&O: I/O last 3 completed shifts: In: -  Out: 800 [Urine:400; Blood:400]          No intake/output data recorded.  Vaccines: TDaP UTD         Flu    Declined   Gen: AAO x 3, NAD  Abdomen: soft, non-tender, non-distended             Fundus: firm, non-tender, U-1  Perineum: healing, no edema or hematoma  Lochia: moderate rubra  Extremities: no edema, no calf pain or tenderness   A/P: PPD # 1 33 y.o., [id number]   Principal Problem:   Postpartum care following vaginal delivery 6/28 Active Problems:   Labor, prolonged latent phase   SVD (6/28)   Laceration of vaginal wall or sulcus without perineal  laceration during delivery   Right labial laceration   Doing well - stable status             Will monitor visual changes and pt will ask for assistance with ambulating as needed  Routine post partum orders  Anticipate discharge tomorrow   7/28, MSN, CNM 02/15/2020, 10:35 AM

## 2020-02-15 NOTE — Anesthesia Postprocedure Evaluation (Signed)
Anesthesia Post Note  Patient: Michaela Miller  Procedure(s) Performed: AN AD HOC LABOR EPIDURAL     Patient location during evaluation: Mother Baby Anesthesia Type: Epidural Level of consciousness: awake and alert Pain management: pain level controlled Vital Signs Assessment: post-procedure vital signs reviewed and stable Respiratory status: spontaneous breathing, nonlabored ventilation and respiratory function stable Cardiovascular status: stable Postop Assessment: no headache, no backache, epidural receding, patient able to bend at knees, no apparent nausea or vomiting, able to ambulate and adequate PO intake Anesthetic complications: no   No complications documented.  Last Vitals:  Vitals:   02/15/20 0203 02/15/20 0602  BP: 118/79 126/75  Pulse: 77 64  Resp: 18 18  Temp: 37 C 36.8 C  SpO2: 100% 100%    Last Pain:  Vitals:   02/15/20 0602  TempSrc: Oral  PainSc: 1    Pain Goal:                   [place]

## 2020-02-15 NOTE — Lactation Note (Signed)
This note was copied from a baby's chart. Lactation Consultation Note  Patient Name: Michaela Miller [id number] Date: 02/15/2020 Reason for consult: Initial assessment;1st time breastfeeding;Primapara;Term;Other (Comment) (mom will be calling with feeding cuesand to check for a tongue - tie) Baby is 13 hours old  As LC entered the room mom resting in bed and dad sitting in the chair holding baby and baby  Asleep. Per mom baby last fed at 1050 - 20 mins./ per mom swallows.  Mom mentioned she thought baby might have a tongue - tie and would the [facility] be able to check it.  LC recommended to call with feeding cues and an oral assessment can be done then.  LC noted mom had coconut oil and asked if she was sore and she responded no not yet and mentioned the RN had provided it due to her fair skin.  LC recommended using her EBM 1st and if needed coconut oil .  LC provided the [facility] pamphlet with phone numbers.    Maternal Data Has patient been taught Hand Expression?: Yes (mom said yes and will need a review) Does the patient have breastfeeding experience prior to this delivery?: No  Feeding Feeding Type:  (per mom baby recently fed at 1050 - asleep)  LATCH Score                   Interventions Interventions: Breast feeding basics reviewed  Lactation Tools Discussed/Used     Consult Status Consult Status: Follow-up Date: 02/15/20 Follow-up type: In-patient    [name] 02/15/2020, 11:58 AM

## 2020-02-16 MED ORDER — IBUPROFEN 600 MG PO TABS: 600.0000 mg | ORAL_TABLET | Freq: Four times a day (QID) | ORAL | 0 refills | Status: AC

## 2020-02-16 NOTE — Discharge Instructions (Signed)

## 2020-02-16 NOTE — Lactation Note (Signed)
This note was copied from a baby's chart. Lactation Consultation Note  Patient Name: Michaela Miller PIRJJ'O Date: 02/16/2020 Reason for consult: Follow-up assessment;Primapara;1st time breastfeeding;Term;Infant weight loss;Other (Comment) (jaundice - Bili check) Baby is 24 hours old - D/C pending due to increased Bili and repeat for 2:30 pm per Dr. Margo Aye.  As LC entered the room, baby latched on the left breast / cross cradle / with poor  Alignment - LC was able to assist and baby stayed latched / increased depth and swallows Noted. Baby fell asleep and mom released.  Baby rooting acting hungry / LC assisted mom to latch on the left breast / football / and worked  On depth and positioning and per mom comfortable.  Baby fed for 20 mins and nipple well rounded when baby released.  LC assessed oral cavity and noted labial frenulum to be just above the gum line with a indention on the gum line. And the lingual frenulum was noted to be short and stretchy, good lateral movement.  LC noted areola edema at the base of both nipples and recommended shells between  Feedings except when sleeping to elongate the nipple / areola complex for a deeper latch.  And a hand pump to keep it simple if needed after hand expressing if to full.  LC stressed the importance of STS feedings until the baby is back to birth weight, gaining steadily and can stay awake for majority of the feeding.  Sore nipple and engorgement prevention and tx reviewed.  Per mom will have a DEBP at home.  LC recommended considering LC O/P F/U in 5-7 days due to the tongue mobility issue.  As of now the latch is not a challenge.  Mom and dad receptive to teaching and expressed their appreciation for the [facility] consult .      Maternal Data Has patient been taught Hand Expression?: Yes  Feeding Feeding Type: Breast Fed (football)  LATCH Score Latch: Grasps breast easily, tongue down, lips flanged, rhythmical sucking.  Audible  Swallowing: Spontaneous and intermittent  Type of Nipple: Everted at rest and after stimulation  Comfort (Breast/Nipple): Soft / non-tender  Hold (Positioning): Assistance needed to correctly position infant at breast and maintain latch.  LATCH Score: 9  Interventions Interventions: Breast feeding basics reviewed;Assisted with latch;Skin to skin;Breast compression;Adjust position;Support pillows;Position options;Shells;Hand pump  Lactation Tools Discussed/Used Tools: Shells;Pump;Flanges Flange Size: 24;27 Shell Type: Inverted Breast pump type: Manual WIC Program: No Pump Review: Milk Storage;Setup, frequency, and cleaning Initiated by:: MAI Date initiated:: 02/16/20   Consult Status Consult Status: Follow-up Date: 02/16/20 Follow-up type: In-patient    [name] 02/16/2020, 10:33 AM

## 2020-02-16 NOTE — Discharge Summary (Signed)
Postpartum Discharge Summary  Date of Service updated 02/16/2020`     Patient Name: Michaela Miller DOB: 1986/02/08 MRN: [id number]  Date of admission: 02/14/2020 Delivery date:02/14/2020  Delivering provider: Burman Foster B  Date of discharge: 02/16/2020  Admitting diagnosis: Labor, prolonged latent phase [O63.0] Intrauterine pregnancy: [redacted]w[redacted]d    Secondary diagnosis:  Principal Problem:   Postpartum care following vaginal delivery 6/28 Active Problems:   Labor, prolonged latent phase   SVD (6/28)   Laceration of vaginal wall or sulcus without perineal laceration during delivery   Right labial laceration  Additional problems: None   Discharge diagnosis: Term Pregnancy Delivered                                              Post partum procedures:None Augmentation: AROM Complications: None  Hospital course: Onset of Labor With Vaginal Delivery      34y.o. yo G2P1011 at 32w6das admitted in Active Labor on 02/14/2020. Patient had an uncomplicated labor course as follows:  Membrane Rupture Time/Date: 9:23 PM ,02/14/2020   Delivery Method:Vaginal, Spontaneous  Episiotomy: None  Lacerations:  1st degree  Patient had an uncomplicated postpartum course.  She is ambulating, tolerating a regular diet, passing flatus, and urinating well. Patient is discharged home in stable condition on 02/16/20.  Newborn Data: Birth date:02/14/2020  Birth time:10:24 PM  Gender:Female  Living status:Living  Apgars:8 ,9  Weight:3130 g   Magnesium Sulfate received: No BMZ received: No Rhophylac:No MMR:No T-DaP:Given prenatally Flu: Yes Transfusion:No  Physical exam  Vitals:   02/15/20 1406 02/15/20 1955 02/15/20 2139 02/16/20 0550  BP: 122/78 119/83 111/76 113/75  Pulse: 64 71 78 67  Resp: '16 15  16  ' Temp: 98.1 F (36.7 C) 98 F (36.7 C) 97.6 F (36.4 C) (!) 97.5 F (36.4 C)  TempSrc: Oral Oral Oral Oral  SpO2: 100% 100% 100% 100%  Weight:      Height:       General: alert,  cooperative and no distress Lochia: appropriate Uterine Fundus: firm Incision: N/A DVT Evaluation: No evidence of DVT seen on physical exam. Labs: Lab Results  Component Value Date   WBC 15.4 (H) 02/15/2020   HGB 10.6 (L) 02/15/2020   HCT 31.9 (L) 02/15/2020   MCV 90.1 02/15/2020   PLT 195 02/15/2020   CMP Latest Ref Rng & Units 04/28/2017  Glucose 65 - 99 mg/dL 106(H)  BUN 6 - 20 mg/dL 14  Creatinine 0.44 - 1.00 mg/dL 0.81  Sodium 135 - 145 mmol/L 139  Potassium 3.5 - 5.1 mmol/L 3.4(L)  Chloride 101 - 111 mmol/L 104  CO2 22 - 32 mmol/L 27  Calcium 8.9 - 10.3 mg/dL 9.1  Total Protein 6.5 - 8.1 g/dL 7.7  Total Bilirubin 0.3 - 1.2 mg/dL 0.8  Alkaline Phos 38 - 126 U/L 53  AST 15 - 41 U/L 18  ALT 14 - 54 U/L 20   Edinburgh Score: Edinburgh Postnatal Depression Scale Screening Tool 02/15/2020  I have been able to laugh and see the funny side of things. 0  I have looked forward with enjoyment to things. 0  I have blamed myself unnecessarily when things went wrong. 1  I have been anxious or worried for no good reason. 1  I have felt scared or panicky for no good reason. 0  Things have been getting  on top of me. 0  I have been so unhappy that I have had difficulty sleeping. 0  I have felt sad or miserable. 0  I have been so unhappy that I have been crying. 0  The thought of harming myself has occurred to me. 0  Edinburgh Postnatal Depression Scale Total 2      After visit meds:  Allergies as of 02/16/2020      Reactions   Codeine Shortness Of Breath   Amoxicillin Rash   Ceclor [cefaclor] Rash   Penicillins Rash   Sulfa Antibiotics Rash      Medication List    TAKE these medications   ibuprofen 600 MG tablet Commonly known as: ADVIL Take 1 tablet (600 mg total) by mouth every 6 (six) hours.   prenatal multivitamin Tabs tablet Take 1 tablet by mouth daily at 12 noon.        Discharge home in stable condition Infant Feeding: Breast Infant Disposition:rooming  in Discharge instruction: per After Visit Summary and Postpartum booklet. Activity: Advance as tolerated. Pelvic rest for 6 weeks.  Diet: routine diet Anticipated Birth Control: Unsure Postpartum Appointment:6 weeks Additional Postpartum F/U: N/A Future Appointments:No future appointments. Follow up Visit:  [facility] Follow up in 6 week(s).   Specialty: Obstetrics and Gynecology Contact information: [address] [place] [id number] [phone]                  02/16/2020 Starla Link, CNM

## 2020-02-17 ENCOUNTER — Ambulatory Visit: Payer: Self-pay

## 2020-02-17 NOTE — Lactation Note (Signed)
This note was copied from a baby's chart. Lactation Consultation Note  Patient Name: Michaela Miller [id number] Date: 02/17/2020 Reason for consult: Follow-up assessment;Primapara;1st time breastfeeding;Term;Infant weight loss 2nd visit baby was latched with depth ,and flanged lips , swallows increased with breast compressions . Per mom occasionally feeling pain when she is feeding and pausing. LC recommended when she baby pauses to do the breast compressions or stimulate baby.  LC reviewed with mom how to release baby from the breast.  Mom aware the clinic will call her for [facility] O/P next week Tuesday or Wednesday.  Mom has the [facility] pamphlet with phone numbers.  Mom and dad expressed appreciation for [facility] assistance.   Maternal Data    Feeding Feeding Type: Breast Fed  LATCH Score Latch:  (latched with depth )  Audible Swallowing:  (multiple swallows )     Comfort (Breast/Nipple):  (per mom occasionally sharp discomfort )  Hold (Positioning):  (mom latched )     Interventions Interventions: Breast feeding basics reviewed;Skin to skin  Lactation Tools Discussed/Used Tools: Shells;Pump;Flanges;Comfort gels Flange Size: 24;27 Shell Type: Inverted Breast pump type: Manual   Consult Status Consult Status: Follow-up Follow-up type: Out-patient    Michaela Miller 02/17/2020, 10:10 AM

## 2020-02-17 NOTE — Lactation Note (Signed)
This note was copied from a baby's chart. Lactation Consultation Note  Patient Name: Michaela Miller [id number] Date: 02/17/2020 Reason for consult: Follow-up assessment;Primapara;1st time breastfeeding;Term;Infant weight loss  Baby is 76 hours old / Bili check at 12.9 at 55 hours  As LC entered the room dad holding baby on his chest and sleeping.  Mom resting in bed.  Per mom having sore nipples - mom showed LC her breast - breast are full compared to yesterday and left nipple as a positional strip ( left nipple ) no breakdown noted.  LC providing comfort gels x 6 days / alternating with breast shells until soreness improves.  Sore nipple and engorgement prevention and tx reviewed.  Mom has shells , hand pump and a DEBP at home.  LC stressed the importance of STS feedings until the baby is back to birth weight, gaining steadily,  And can stay awake majority of the feeding.  LC offered and recommended to come back for [facility] O/P appt in 5-7 days, moms and dads receptive.  LC placed a request for LC O/P appt.      Maternal Data    Feeding Feeding Type:  (baby has recently fed)  LATCH Score                   Interventions Interventions: Breast feeding basics reviewed  Lactation Tools Discussed/Used Tools: Shells;Pump;Flanges;Comfort gels Flange Size: 24;27 Shell Type: Inverted Breast pump type: Manual   Consult Status Consult Status: Follow-up Follow-up type: Out-patient    Michaela Miller 02/17/2020, 9:25 AM

## 2021-04-09 IMAGING — US US MFM OB COMP +14 WKS
2 series · 13 of 28 positions shown · non-contrast
Comparison: none

[Series 1: us mfm ob comp +14 wks · 68 acquisitions, 9 frames shown (1 of 2)]
[im 4/68]
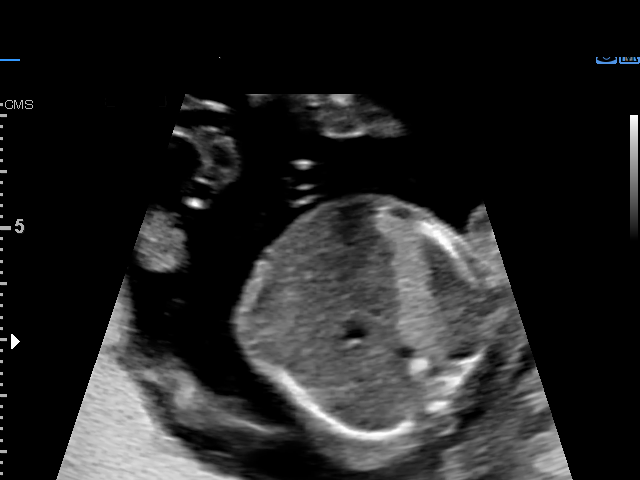
[im 11/68]
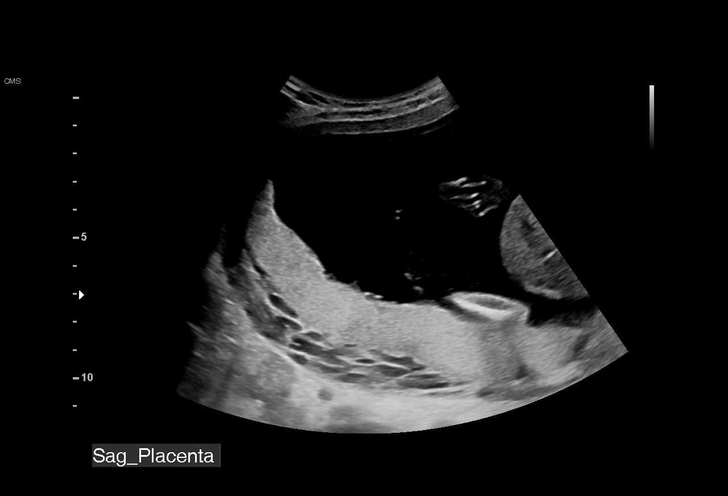
[im 18/68]
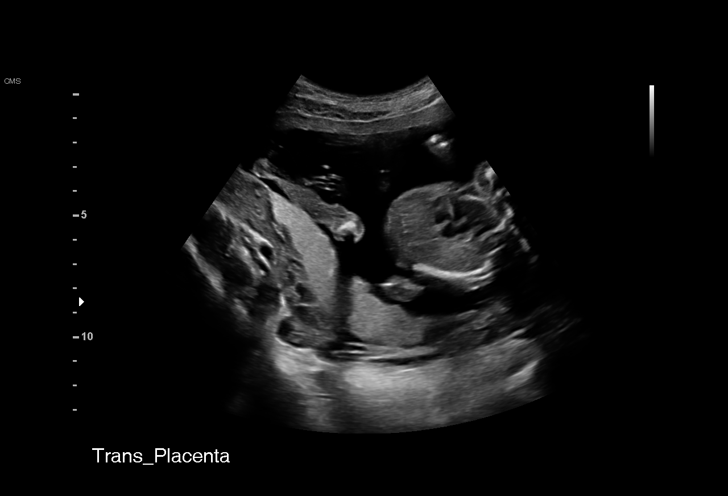
[im 25/68]
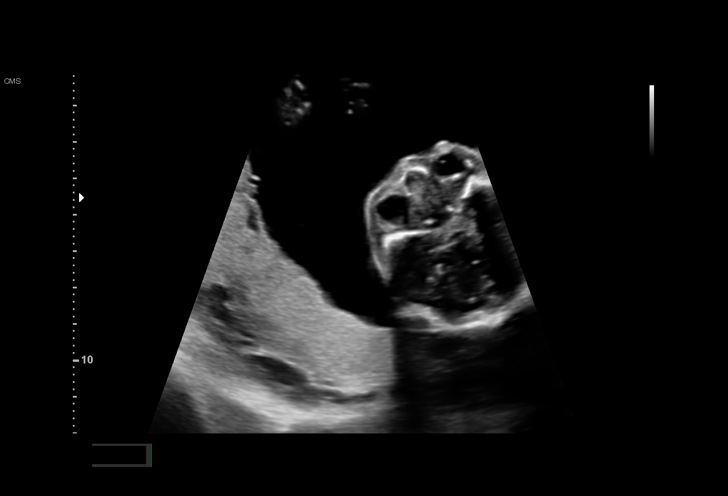
[im 32/68]
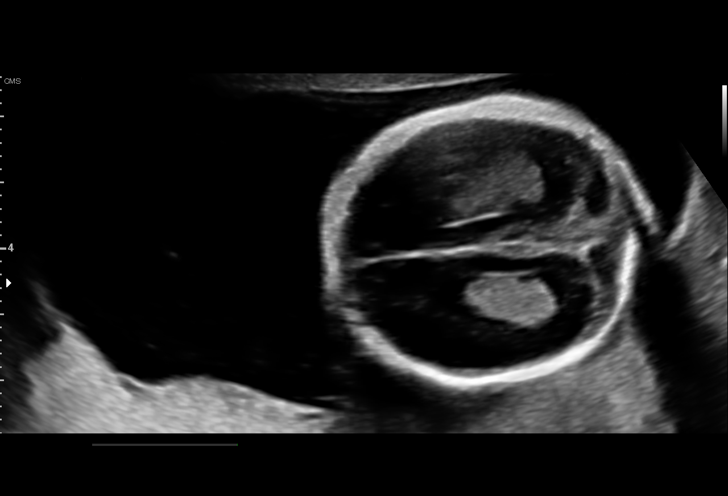
[im 39/68]
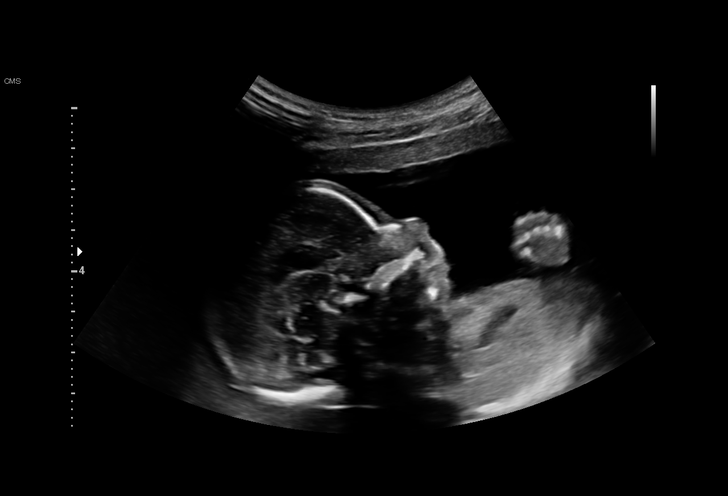
[im 50/68]
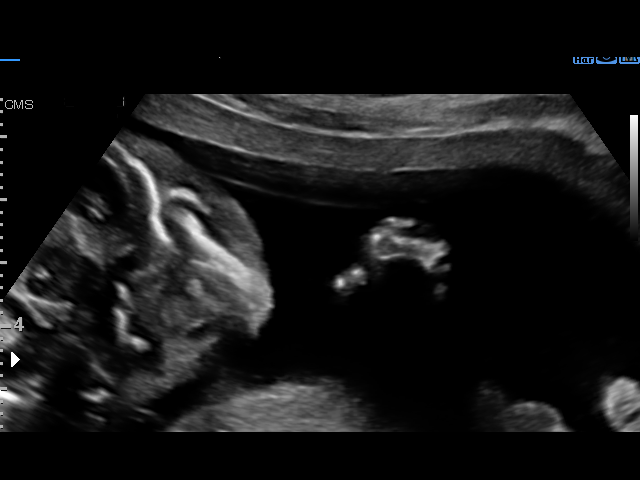
[im 57/68]
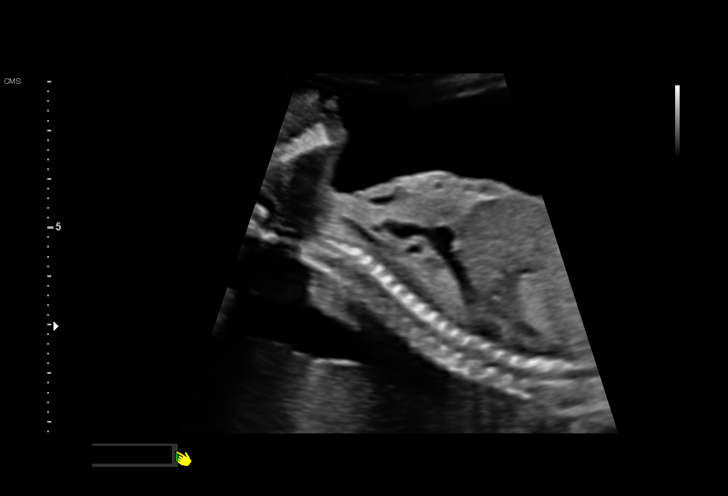
[im 64/68]
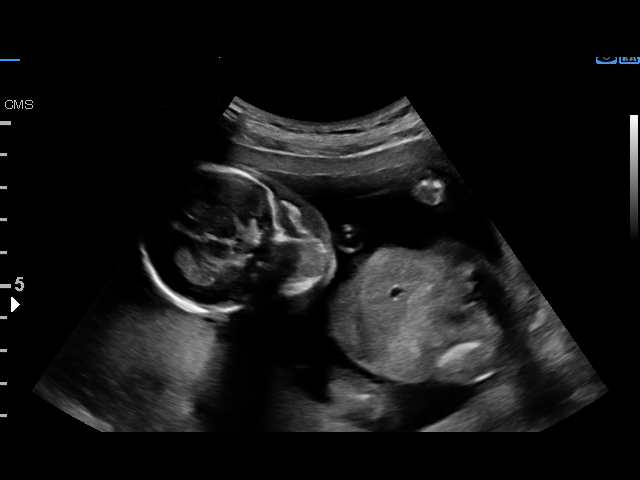

[Series 2: us mfm ob comp +14 wks · 25 acquisitions, 4 frames shown (2 of 2)]
[im 1/25]
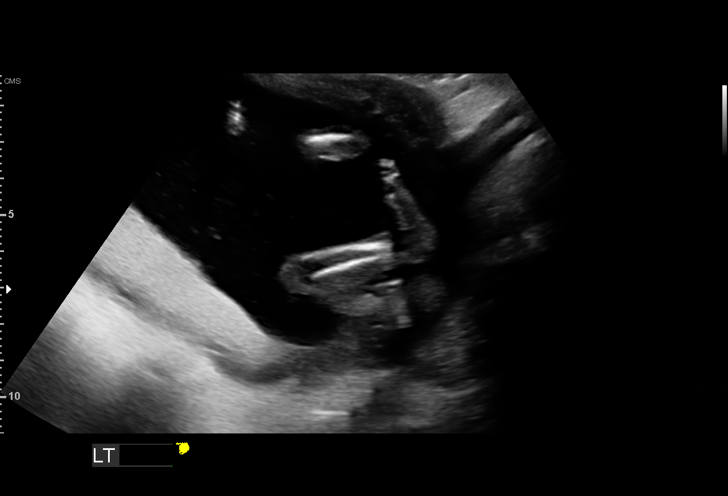
[im 7/25]
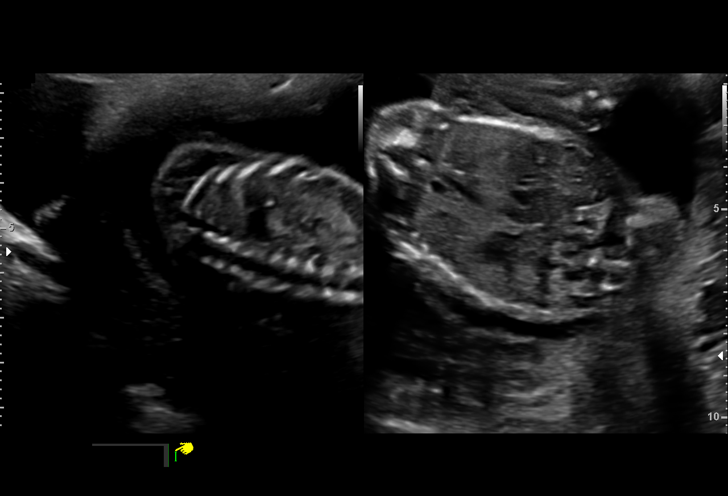
[im 14/25]
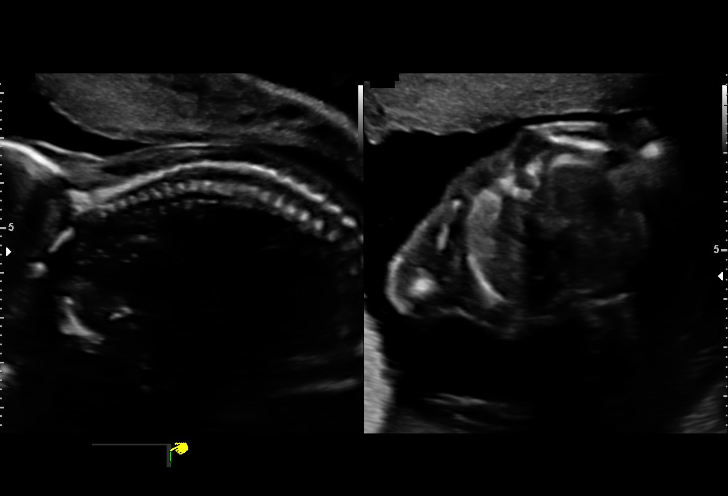
[im 21/25]
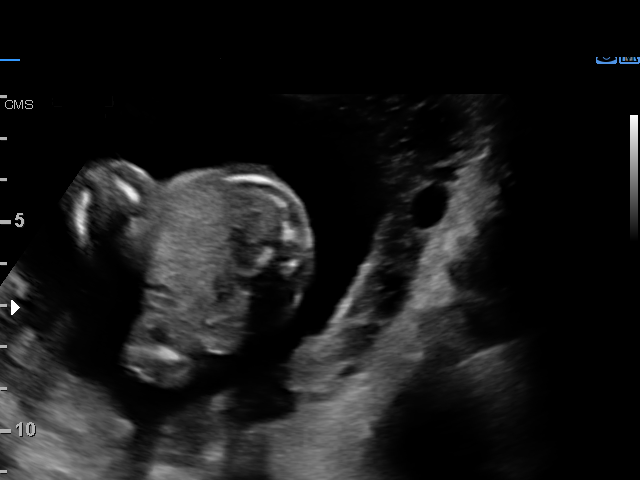

[13 of 28 positions shown; findings below may reference images not displayed]

Obstetrics &
                                                             Gynecology
                                                             [address]
                                                             [place].

 ----------------------------------------------------------------------

 ----------------------------------------------------------------------
Indications

  19 weeks gestation of pregnancy
  Encounter for antenatal screening for
  malformations
 ----------------------------------------------------------------------
Fetal Evaluation

 Num Of Fetuses:          1
 Fetal Heart Rate(bpm):   167
 Cardiac Activity:        Observed
 Presentation:            Breech
 Placenta:                Posterior
 P. Cord Insertion:       Visualized

 Amniotic Fluid
 AFI FV:      Within normal limits

                             Largest Pocket(cm)

Biometry

 BPD:      41.7  mm     G. Age:  18w 4d         28  %    CI:        69.92   %    70 - 86
                                                         FL/HC:       17.8  %    16.1 -
 HC:      159.1  mm     G. Age:  18w 5d         25  %    HC/AC:       1.06       1.09 -
 AC:      150.3  mm     G. Age:  20w 2d         81  %    FL/BPD:      67.9  %
 FL:       28.3  mm     G. Age:  18w 5d         27  %    FL/AC:       18.8  %    20 - 24
 HUM:      25.9  mm     G. Age:  18w 1d         23  %
 CER:      18.7  mm     G. Age:  18w 2d         27  %
 NFT:       5.7  mm
 CM:        3.8  mm

 Est. FW:     292   gm   0 lb 10 oz      63  %
OB History

 Gravidity:    1         Term:   0        Prem:   0        SAB:   0
 TOP:          0       Ectopic:  0        Living: 0
Gestational Age

 LMP:           17w 6d        Date:  05/20/19                 EDD:   02/24/20
 U/S Today:     19w 1d                                        EDD:   02/15/20
 Best:          19w 1d     Det. By:  Early Ultrasound         EDD:   02/15/20
                                     (07/27/19)
Anatomy

 Cranium:               Appears normal         Aortic Arch:            Appears normal
 Cavum:                 Appears normal         Ductal Arch:            Appears normal
 Ventricles:            Appears normal         Diaphragm:              Appears normal
 Choroid Plexus:        Appears normal         Stomach:                Appears normal, left
                                                                       sided
 Cerebellum:            Appears normal         Abdomen:                Appears normal
 Posterior Fossa:       Appears normal         Abdominal Wall:         Appears nml (cord
                                                                       insert, abd wall)
 Nuchal Fold:           Appears normal         Cord Vessels:           Appears normal (3
                                                                       vessel cord)
 Face:                  Appears normal         Kidneys:                Appear normal
                        (orbits and profile)
 Lips:                  Appears normal         Bladder:                Appears normal
 Thoracic:              Appears normal         Spine:                  Appears normal
 Heart:                 Appears normal         Upper Extremities:      Appears normal
                        (4CH, axis, and
                        situs)
 RVOT:                  Appears normal         Lower Extremities:      Appears normal
 LVOT:                  Appears normal

 Other:  Heels and 5th digit visualized. Nasal bone visualized.
Cervix Uterus Adnexa

 Cervix
 Length:            4.1  cm.
 Normal appearance by transabdominal scan.

 Uterus
 No abnormality visualized.

 Left Ovary
 Within normal limits.

 Right Ovary
 Within normal limits.

 Cul De Sac
 No free fluid seen.
 Adnexa
 No abnormality visualized.
Impression

 Normal anatomy
 Good amniotic fluid and fetal movement
 Low risk NIPS
Recommendations

 Follow up as clinically indicated.
